# Patient Record
Sex: Female | Born: 1960 | Race: White | Hispanic: No | Marital: Married
Health system: Southern US, Community
[De-identification: ages and names within clinical notes are randomized; demographics above are authoritative.]

---

## 2001-03-06 ENCOUNTER — Other Ambulatory Visit: Admission: RE | Admit: 2001-03-06 | Discharge: 2001-03-06 | Payer: Self-pay | Admitting: *Deleted

## 2002-07-10 ENCOUNTER — Other Ambulatory Visit: Admission: RE | Admit: 2002-07-10 | Discharge: 2002-07-10 | Payer: Self-pay | Admitting: Dermatology

## 2002-07-11 ENCOUNTER — Ambulatory Visit (HOSPITAL_COMMUNITY): Admission: RE | Admit: 2002-07-11 | Discharge: 2002-07-11 | Payer: Self-pay | Admitting: Otolaryngology

## 2002-07-11 ENCOUNTER — Encounter: Payer: Self-pay | Admitting: Otolaryngology

## 2004-02-06 ENCOUNTER — Ambulatory Visit (HOSPITAL_COMMUNITY): Admission: RE | Admit: 2004-02-06 | Discharge: 2004-02-06 | Payer: Self-pay | Admitting: Family Medicine

## 2004-02-10 ENCOUNTER — Ambulatory Visit (HOSPITAL_COMMUNITY): Admission: RE | Admit: 2004-02-10 | Discharge: 2004-02-10 | Payer: Self-pay | Admitting: Family Medicine

## 2004-02-16 ENCOUNTER — Other Ambulatory Visit: Admission: RE | Admit: 2004-02-16 | Discharge: 2004-02-16 | Payer: Self-pay | Admitting: Obstetrics and Gynecology

## 2004-04-07 ENCOUNTER — Encounter (INDEPENDENT_AMBULATORY_CARE_PROVIDER_SITE_OTHER): Payer: Self-pay | Admitting: Specialist

## 2004-04-07 ENCOUNTER — Ambulatory Visit (HOSPITAL_COMMUNITY): Admission: RE | Admit: 2004-04-07 | Discharge: 2004-04-07 | Payer: Self-pay | Admitting: Obstetrics and Gynecology

## 2005-02-09 ENCOUNTER — Emergency Department (HOSPITAL_COMMUNITY): Admission: EM | Admit: 2005-02-09 | Discharge: 2005-02-09 | Payer: Self-pay | Admitting: Emergency Medicine

## 2005-05-28 IMAGING — CT CT ABDOMEN W/O CM
1 of 2 series · 15 of 32 positions shown, 19 images · non-contrast
Comparison: none

CLINICAL DATA: Renal calculus; lt flank pain
 CT ABDOMEN AND PELVIS WITHOUT CONTRAST
 Multidetector helical CT imaging abdomen and pelvis performed using kidney stone protocol.  Neither oral nor intravenous contrast utilized for this indication.
 ABDOMEN
 Prior gastric surgery.  No renal calculi or hydronephrosis.  Ureters normal caliber.  No upper abdominal mass, adenopathy, or free fluid.  The solid organs are otherwise unremarkable for an exam lacking IV and oral contrast.  No bowel dilatation.  Status post cholecystectomy. 
 IMPRESSION
 Prior cholecystectomy and gastric surgery.  No acute abnormalities.  Specifically, no urinary tract calcification or obstruction.  
 PELVIS
 No pelvic mass, adenopathy, or free fluid.  A soft tissue mass 6.2 x 4.6 cm in size is seen at the anterior left aspect of the upper uterus.  This could represent an exophytic uterine fibroid or a soft tissue mass arising from the left ovary.  This requires evaluation by pelvic and transvaginal sonography to characterize.  There appears to be a low attenuation structure adjacent to this which most likely represents normal left ovary.  No distal ureteral calcification or dilatation.  Pelvic bowel loops unremarkable.  
 6.2 x 4.6 cm diameter soft tissue mass adjacent to uterus left of midline, question uterine fibroid versus ovarian mass.  Recommend pelvic ultrasound.

[Series 8977: — · axial · 0.65mm/px · z∈[+1309,+1669]mm · 15 of 78 slices shown, 19 images]
[im 3/78  soft-tissue]
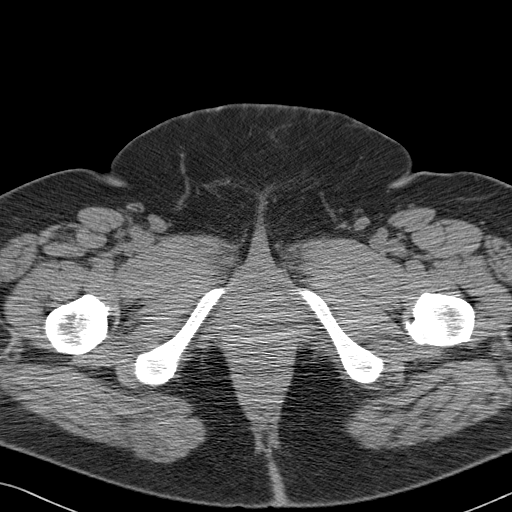
[im 3/78  bone]
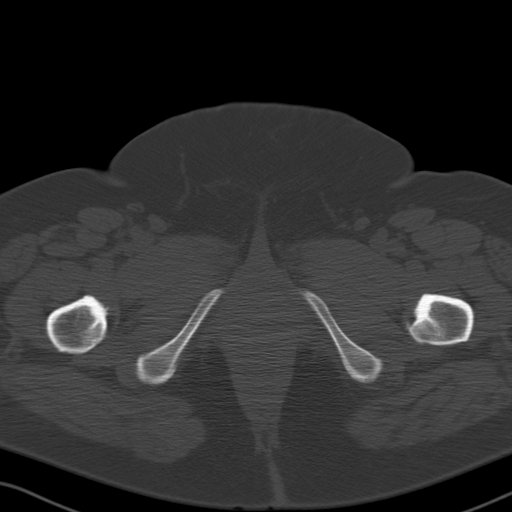
[im 9/78  soft-tissue]
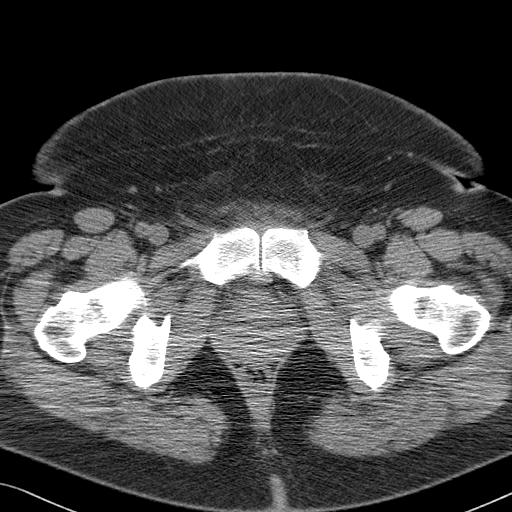
[im 15/78  soft-tissue]
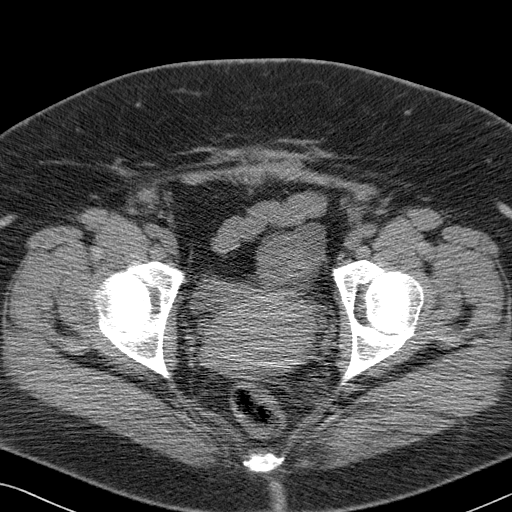
[im 21/78  soft-tissue]
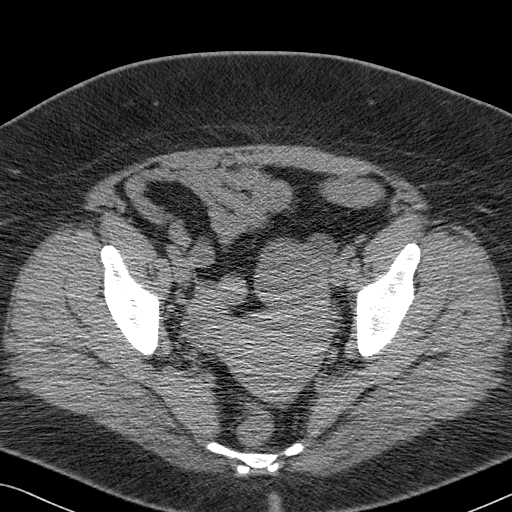
[im 27/78  soft-tissue]
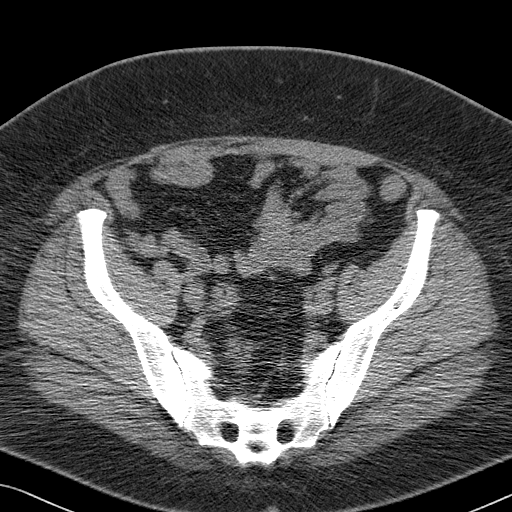
[im 33/78  soft-tissue]
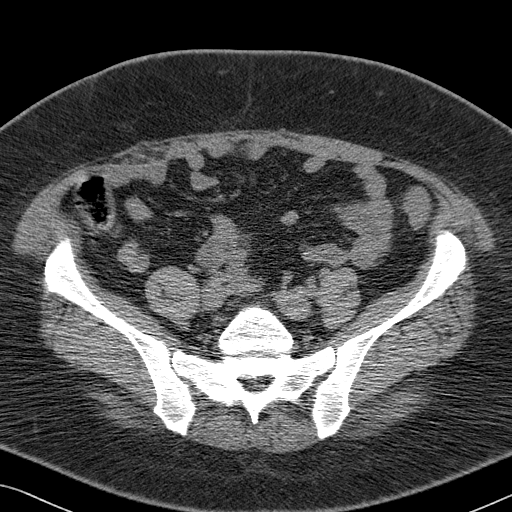
[im 39/78  soft-tissue]
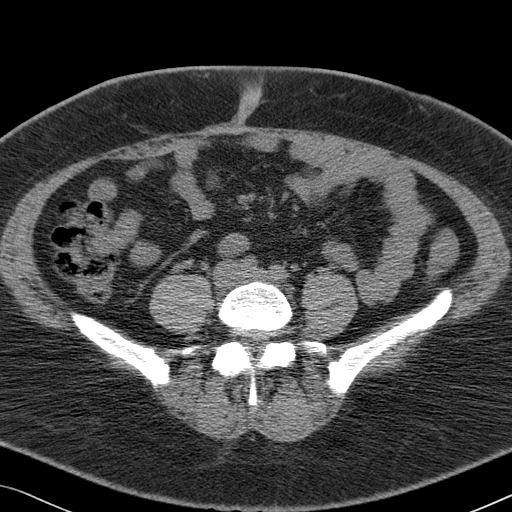
[im 45/78  soft-tissue]
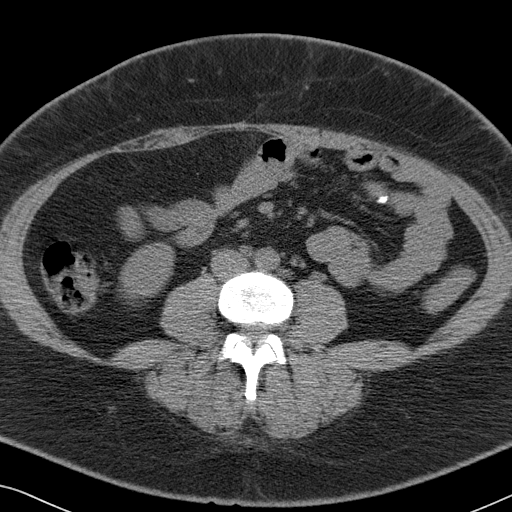
[im 51/78  soft-tissue]
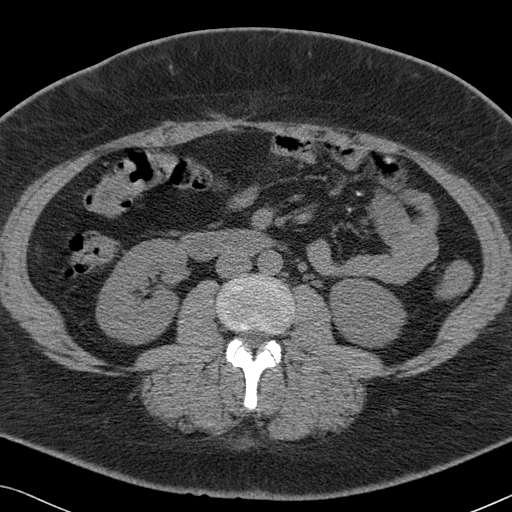
[im 51/78  bone]
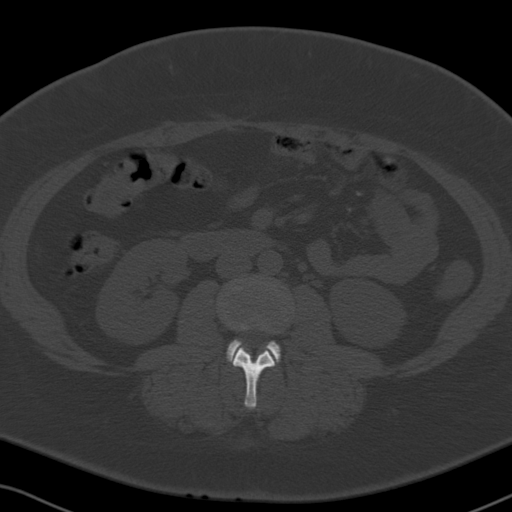
[im 57/78  soft-tissue]
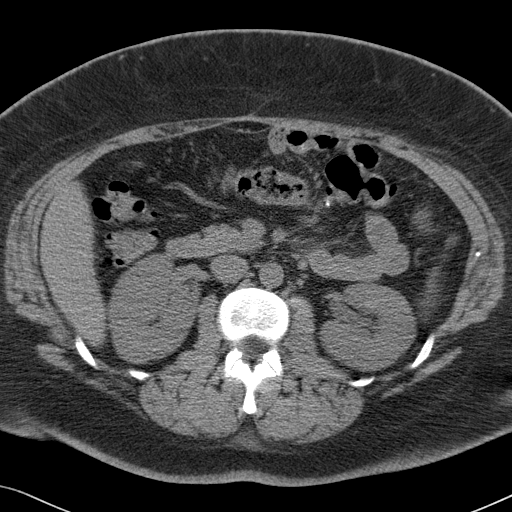
[im 63/78  soft-tissue]
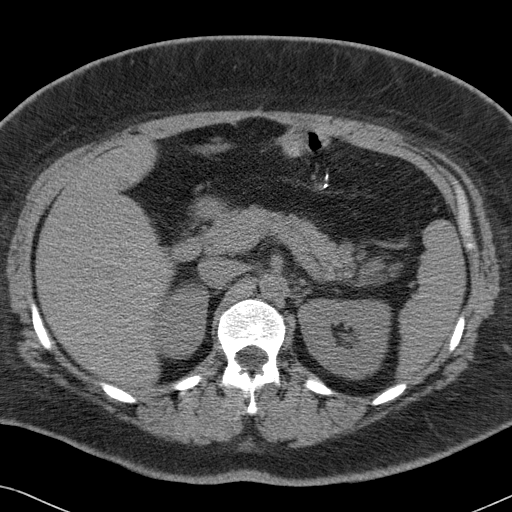
[im 66/78  lung]
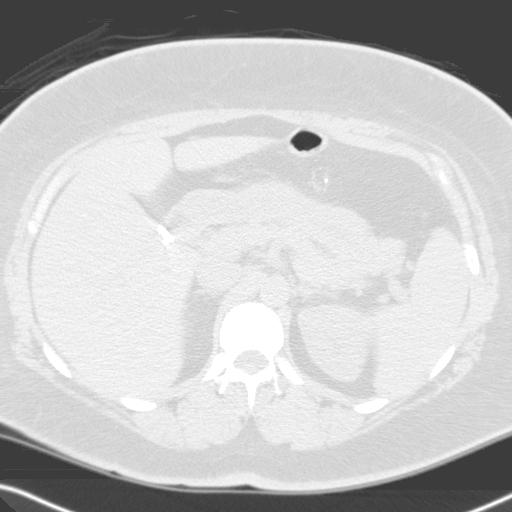
[im 69/78  soft-tissue]
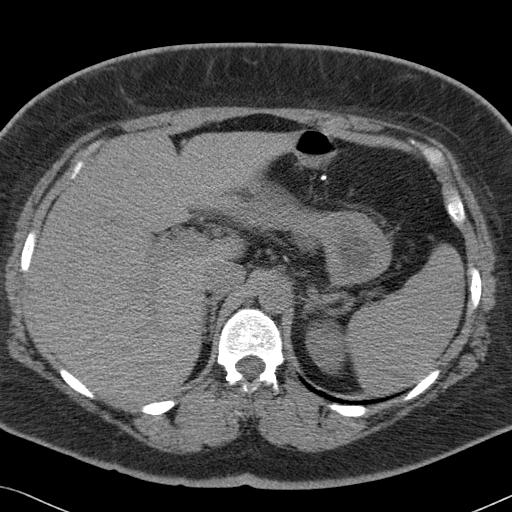
[im 69/78  lung]
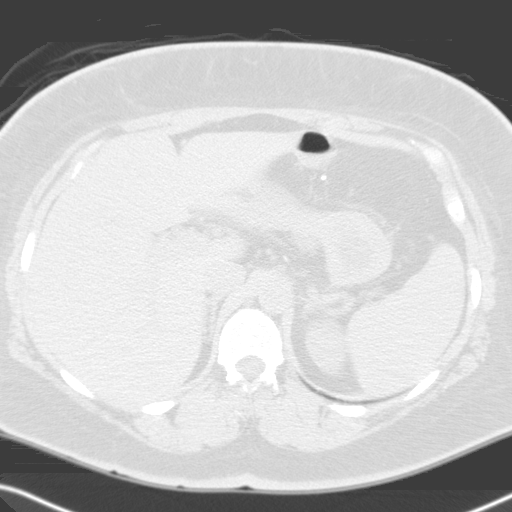
[im 72/78  lung]
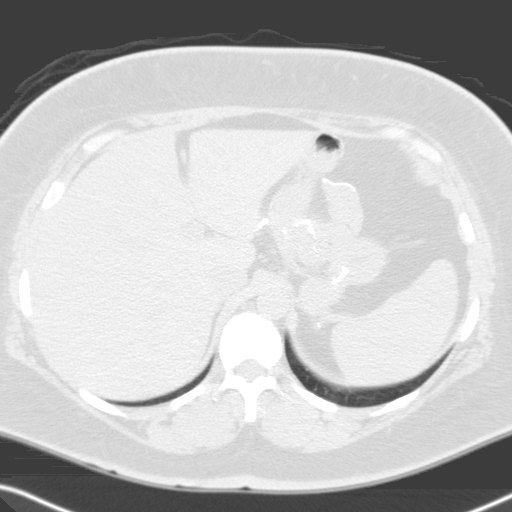
[im 75/78  soft-tissue]
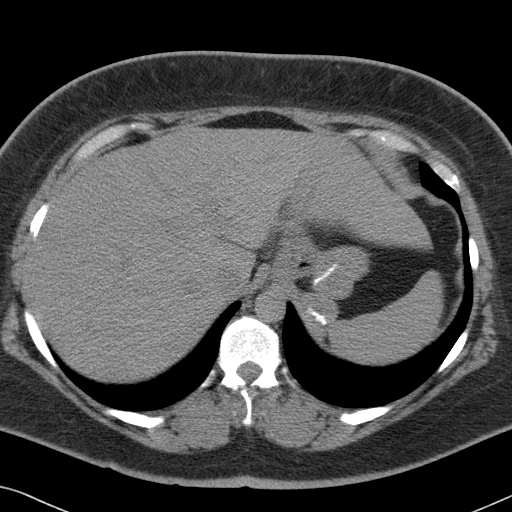
[im 75/78  lung]
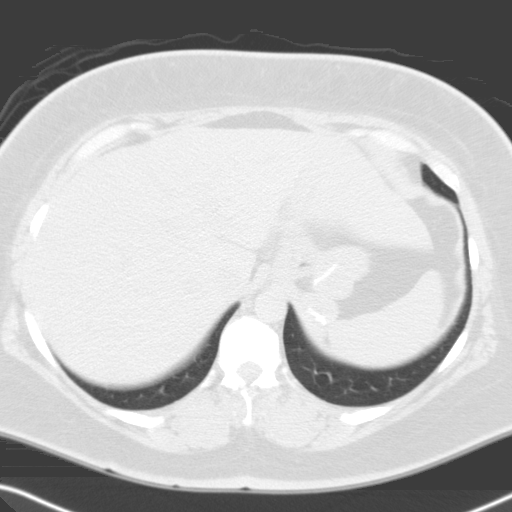

[15 of 32 positions shown; findings below may reference images not displayed]

## 2005-07-26 ENCOUNTER — Other Ambulatory Visit: Admission: RE | Admit: 2005-07-26 | Discharge: 2005-07-26 | Payer: Self-pay | Admitting: Obstetrics and Gynecology

## 2005-09-02 ENCOUNTER — Encounter (INDEPENDENT_AMBULATORY_CARE_PROVIDER_SITE_OTHER): Payer: Self-pay | Admitting: Specialist

## 2005-09-02 ENCOUNTER — Ambulatory Visit (HOSPITAL_COMMUNITY): Admission: RE | Admit: 2005-09-02 | Discharge: 2005-09-02 | Payer: Self-pay | Admitting: Obstetrics and Gynecology

## 2006-01-11 ENCOUNTER — Ambulatory Visit (HOSPITAL_COMMUNITY): Admission: RE | Admit: 2006-01-11 | Discharge: 2006-01-11 | Payer: Self-pay | Admitting: Family Medicine

## 2006-03-03 ENCOUNTER — Encounter (INDEPENDENT_AMBULATORY_CARE_PROVIDER_SITE_OTHER): Payer: Self-pay | Admitting: Specialist

## 2006-03-03 ENCOUNTER — Ambulatory Visit (HOSPITAL_COMMUNITY): Admission: RE | Admit: 2006-03-03 | Discharge: 2006-03-03 | Payer: Self-pay | Admitting: Obstetrics and Gynecology

## 2008-12-16 ENCOUNTER — Ambulatory Visit (HOSPITAL_COMMUNITY): Admission: RE | Admit: 2008-12-16 | Discharge: 2008-12-16 | Payer: Self-pay | Admitting: Family Medicine

## 2009-05-11 ENCOUNTER — Ambulatory Visit (HOSPITAL_COMMUNITY): Admission: RE | Admit: 2009-05-11 | Discharge: 2009-05-11 | Payer: Self-pay | Admitting: Family Medicine

## 2010-04-30 ENCOUNTER — Encounter: Admission: RE | Admit: 2010-04-30 | Discharge: 2010-04-30 | Payer: Self-pay | Admitting: Obstetrics and Gynecology

## 2010-08-05 ENCOUNTER — Ambulatory Visit (HOSPITAL_COMMUNITY): Admission: RE | Admit: 2010-08-05 | Discharge: 2010-08-06 | Payer: Self-pay | Admitting: Neurosurgery

## 2011-01-27 LAB — CBC
MCHC: 33.1 g/dL (ref 30.0–36.0)
RDW: 13.3 % (ref 11.5–15.5)

## 2011-01-27 LAB — BASIC METABOLIC PANEL
CO2: 32 mEq/L (ref 19–32)
Calcium: 9.2 mg/dL (ref 8.4–10.5)
Chloride: 103 mEq/L (ref 96–112)
Creatinine, Ser: 0.71 mg/dL (ref 0.4–1.2)
GFR calc Af Amer: >60 mL/min (ref >60)
GFR calc non Af Amer: >60 mL/min (ref >60)
Glucose, Bld: 124 mg/dL — ABNORMAL HIGH (ref 70–99)
Potassium: 4.2 mEq/L (ref 3.5–5.1)
Sodium: 140 mEq/L (ref 135–145)

## 2011-01-27 LAB — GLUCOSE, CAPILLARY
Glucose-Capillary: 127 mg/dL — ABNORMAL HIGH (ref 70–99)
Glucose-Capillary: 190 mg/dL — ABNORMAL HIGH (ref 70–99)

## 2011-01-27 LAB — SURGICAL PCR SCREEN: MRSA, PCR: NEGATIVE

## 2011-04-01 NOTE — Op Note (Signed)
[name], [name] NO.:  [id number]   MEDICAL RECORD NO.:  [id number]          PATIENT TYPE:  AMB   LOCATION:  [facility]                           FACILITY:  [facility]   PHYSICIAN:  Juluis Mire, M.D.   DATE OF BIRTH:  10/16/1961   DATE OF PROCEDURE:  03/03/2006  DATE OF DISCHARGE:  03/03/2006                                 OPERATIVE REPORT   PREOPERATIVE DIAGNOSES:  1.  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  2.  Cystic enlargement of the right ovary.   OPERATIVE PROCEDURE:  1.  Hysteroscopy.  2.  Endometrial curettage.  3.  NovaSure ablation.  4.  Open laparoscopy.  5.  Right ovarian cystectomies.   SURGEON:  Juluis Mire, M.D.   ANESTHESIA:  General.   ESTIMATED BLOOD LOSS:  Minimal.   PACK AND DRAINS:  None.   INTRAOPERATIVE BLOOD REPLACEMENT:  None.   COMPLICATIONS:  None.   INDICATION:  Indications are dictated in history and physical.   DESCRIPTION OF PROCEDURE:  The patient was taken to the OR and placed in a  supine position.  After satisfactory level of general endotracheal  anesthesia was obtained, the patient was placed in the dorsal lithotomy  position using the Allen stirrups.  At this point in time, the abdomen,  perineum and vagina were prepped out with Betadine and the patient was  draped out as a sterile field.  Speculum was placed in the vaginal vault.  The cervix was grasped with a Christella Hartigan tenaculum.  Uterus sounded to 10 cm.  Endocervical length was 5 cm.  The cervix was dilated to a size 33 Pratt  dilator.  The hysteroscope was introduced and the intrauterine cavity was  distended using lactated Ringer's.  Visualization revealed a normal  endometrial cavity.  Both tubal ostomies were visualized and noted to be  normal.  Endometrial curettings were then obtained.  At this point in time,  the NovaSure was deployed inside the intrauterine cavity.  Total cavity  width was 4.7 cm.  CO2 patency test was passed.  Ablation was  undertaken.  The NovaSure was then removed intact.  Repeat hysteroscopy revealed  adequately ablated endometrium and no evidence of perforation.  A Hulka  tenaculum was put in place.  Single-tooth tenaculum and speculum were then  removed.  Bladder was emptied by in-and-out catheterization.   At this point in time, a subumbilical incision was made with a knife and  carried through subcutaneous tissue.  Fascia was identified and entered  sharply and the incision in the fascia extended laterally.  The peritoneum  was entered with blunt pressure.  A Taut open laparoscopic trocar was put in  place and secured.  The laparoscope was then introduced.  A 5-mm trocar was  put in place in the suprapubic area.  Visualization revealed the left tube  and ovary to be surgically absent.  The uterus was unremarkable.  Right  ovary had 2 small cysts measuring approximately 2 to 2.5 cm each.  No  adhesions or excrescences were noted.  There was no free fluid.  A third 5-  mm trocar was put in place in the left lower quadrant after visualization of  the epigastric vessels.  The uterus was secured.  The cyst on the medial  pole was then opened, clear fluid was obtained and the cyst lining was  removed and sent for pathologic review.  Next, the cyst on the more lateral  pole was identified; it was entered and a mucinous material was noted; this  was irrigated out thoroughly and the cyst was excised from the ovary and  sent for pathologic review.  We brought about hemostasis using the bipolar.  No active bleeding was noted.  Both cysts were adequately removed.  The  abdomen deflated of its carbon dioxide.  All trocars were removed.  The  subumbilical fascia were closed with 2 figure-of-eights of 0 Vicryl.  The  suprapubic incisions were closed with Dermabond.  Sponge, instrument and  needle count were reported as correct by circulating nurse x2.  Hulka  tenaculum was then removed.  The patient was taken out of  the dorsal  lithotomy position and once alert and extubated, transferred to recovery  room in good condition.      Juluis Mire, M.D.  Electronically Signed     JSM/MEDQ  D:  03/03/2006  T:  03/06/2006  Job:  [id number]

## 2011-04-01 NOTE — H&P (Signed)
NAME:  [name], GROSS NO.:  [id number]   MEDICAL RECORD NO.:  [id number]          PATIENT TYPE:  AMB   LOCATION:  [facility]                           FACILITY:  [facility]   PHYSICIAN:  Juluis Mire, M.D.   DATE OF BIRTH:  10-Jul-1961   DATE OF ADMISSION:  03/03/2006  DATE OF DISCHARGE:                                HISTORY & PHYSICAL   The patient is a 50 year old, gravida 4, para 1, abortus 3 female who  presents for diagnostic laparoscopy with laser standby as well as  hysteroscopy and NovaSure ablation. The patient's had trouble with  menorrhagia. She had a saline infusion ultrasound performed. The patient's  had previous hysteroscopic evaluation, resection of a polyp in October 2006  with the finding of a benign pathology. Because of continued menorrhagia,  the patient has decided to proceed with endometrial ablation. Other  alternatives have been discussed including birth control pills or a Mirena  IUD.   The patient has also been followed with a right ovarian cyst. 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 We have been watching the right ovary, it had a complex cyst  that remained relatively stable in the 3-4 cm side. We are going to proceed  with laparoscopic evaluation.   ALLERGIES:  She is allergic to PENICILLIN and ERYTHROMYCIN.   MEDICATIONS:  Lexapro, Diovan and multivitamins.   PAST MEDICAL HISTORY:  Under active management of hypertension by Dr.  Phillips Odor. On medications as noted.   PAST SURGICAL HISTORY:  1.  As an infant she had intussusception, had exploratory surgery with      correction of intussusception. Her appendix was removed at that time.  2.  Had exploratory laparotomy done in 1999 and removal of a paratubular      cyst.  3.  She had a laparoscopic cholecystectomy in November 1992.  4.  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  5.  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   OBSTETRICAL HISTORY:  She has had two prior cesarean sections, other  pregnancies were miscarriages.   FAMILY HISTORY:  Father with a history of diabetes, mother and father have a  history of hypertension and heart disease.   SOCIAL HISTORY:  There is no tobacco or alcohol.   REVIEW OF SYSTEMS:  Noncontributory.   PHYSICAL EXAMINATION:  VITAL SIGNS:  The patient is afebrile with stable  vital signs.  HEENT:  The patient is normocephalic, pupils equal round and reactive to  light and accommodation. Extraocular movements intact. Sclera and  conjunctiva were clear. Oropharynx clear.  NECK:  Without thyromegaly.  BREASTS:  No discreet masses.  LUNGS:  Clear.  CARDIOVASCULAR:  Regular rate and rhythm without murmurs or gallops.  ABDOMEN:  Benign, no mass, organomegaly or tenderness.  PELVIC:  Normal external genitalia. Vaginal mucosa clear. Cervix  unremarkable. Uterus __________ normal size. Adnexa unremarkable, no  palpable abnormalities.  EXTREMITIES:  Trace edema.  NEUROLOGIC:  Grossly within normal limits.   IMPRESSION:  1.  Persistent cystic enlargement of the right ovary.  2.  Menorrhagia, probable adenomyosis.  3.  Numerous gynecological and abdominal procedures.   PLAN:  The patient will undergo open laparoscopy for evaluation of the right  tube. We are going to try to keep the ovary intact and possibly remove the  cyst. We will then perform hysteroscopy and NovaSure ablation. Success rates  of approximately 90% are quoted with amenorrhea rates of 40%. The risk of  the procedures have been discussed including risk of infection. The risk of  hemorrhage that could require transfusion, risk of AIDS or hepatitis, risk  of injury to adjacent organs requiring exploratory surgery, risk of deep  venous thrombosis and pulmonary embolus. The patient expressed an  understanding of  the indications and risks.      Juluis Mire, M.D.  Electronically Signed     JSM/MEDQ  D:  03/03/2006  T:  03/03/2006  Job:  [id number]

## 2011-04-01 NOTE — Op Note (Signed)
NAME:  [name], [name]                         ACCOUNT NO.:  [id number]   MEDICAL RECORD NO.:  [id number]                   PATIENT TYPE:  AMB   LOCATION:  [facility]                                  FACILITY:  [facility]   PHYSICIAN:  Juluis Mire, M.D.                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   OPERATION:  Open laparoscopy, lysis of adhesions. Left salpingo-  oophorectomy.   SURGEON:  Juluis Mire, M.D.   ANESTHESIA:  General endotracheal.   ESTIMATED BLOOD LOSS:  Minimal.   PACKS/DRAINS:  None.   BLOOD REPLACED:  None.   COMPLICATIONS:  None.   INDICATIONS FOR PROCEDURE:  As dictated in the history and physical.   DESCRIPTION OF PROCEDURE:  The patient was taken to the OR, placed in the  supine position. After a satisfactory level of general endotracheal  anesthesia was obtained, the patient was placed in the dorsal lithotomy  position using the Allen stirrups. The abdomen, perineum and vagina were  prepped out with Betadine. The Foley was placed to straight drain.  Examination under anesthesia did reveal a left sided adnexal mass.  A Hulka  tenaculum was put in place and secured. The patient was draped out as a  sterile field. A subumbilical incision made with a knife.  The incision was  extended through the subcutaneous tissue. The fascia was identified and  entered sharply, incision in the fascia extended laterally.  The rectus  muscle was separated in the midline. The peritoneum was elevated and  entered. There was no evidence of periumbilical adhesions, no evidence of  injury to adjacent organs.  The laparoscopic trocar was put in place and  secured. The laparoscope was then introduced, no ascites were noted.  A 5 mm  trocar was put in place in the suprapubic area under direct visualization,  the uterus was elevated, the left ovary was  involved with multiple cystic  enlargements. There was one small adhesion that was taken down, this was to  the left abdominal sidewall. There was on ascites, there was no evidence of  any metastatic process going on. We have used the whole abdomen including  the upper abdomen, liver. Her appendix was surgically absent, gallbladder  was surgically absent. She had one omental adhesions in the upper abdomen.  Visualization of the omentum revealed it to be unremarkable.  The uterus was  elevated, there was a fundal fibroid. The right ovary was somewhat adherent  to the right pelvic sidewall but unremarkable. We did pelvic washings at  this point, these were sent for cytology. A third 5 mm trocar was put in  place in the left lower quadrant. The ovary was  again elevated on the left  side and visualized. There were multiple cysts, no solid areas, no  excrescences on the outside. We first identified the ureter and the pelvic  sidewall then using the gyrus tripolar we cauterized and incised the ovarian  vasculature well above the ureter. We continued this dissection up to the  uteroovarian pedicle.  It was then isolated, cauterized and incised using  the tripolar.  We had good hemostasis at this point. The laparoscope and  subumbilical trocar was removed. We isolated the left ovary under the  umbilicus. We elevated it with Kelly's. We then made small cystostomy  incisions and drained the cyst with the Nezhat suction. I do not think we  had any or at least much intraabdominal spill. After drainage of 1 or 2  cysts, we delivered the ovary intact.  We incised the cyst, they were smooth  walled, no papillary areas and I did not detect any solid areas. This was  sent for routine pathology.  We the put the open laparoscopic trocar back in  place and secured it, the laparoscope was  reintroduced.  I thoroughly  irrigated the pelvis and upper abdomen.  All irrigation was removed.  I went  back on the left where the left ovary had been and there was one little area  oozing and again we used the tripolar to bring this under control.  At this  point in time, we had excellent hemostasis.  Again thoroughly irrigated the  pelvis, all irrigation was removed. Urine output remained clear and  adequate. At this point in time, the abdomen was deflated with carbon  dioxide, all trocars removed. The subumbilical fascia was closed with two  figure-of-eights of #0 Vicryl, skin with interrupted subcuticular's of 4-0  Vicryl, suprapubic incisions were closed with Dermabond.  A Hulka tenaculum  was then removed. The Foley was removed. The patient was taken out of the  dorsal lithotomy position once alert and extubated and transferred to the  recovery room in good condition. Sponge, needle and instrument counts were  reported as correct by the circulating nurse x2.                                               Juluis Mire, M.D.    JSM/MEDQ  D:  04/07/2004  T:  04/07/2004  Job:  [id number]

## 2011-04-01 NOTE — Op Note (Signed)
[name], [name] NO.:  [id number]   MEDICAL RECORD NO.:  [id number]          PATIENT TYPE:  AMB   LOCATION:  [facility]                           FACILITY:  [facility]   PHYSICIAN:  Juluis Mire, M.D.   DATE OF BIRTH:  06/05/1961   DATE OF PROCEDURE:  09/02/2005  DATE OF DISCHARGE:                                 OPERATIVE REPORT   PREOPERATIVE DIAGNOSES:  1.  Abnormal uterine bleeding.  2.  Endometrial polyp.  3.  Uterine fibroids.   POSTOPERATIVE DIAGNOSES:  1.  Abnormal uterine bleeding.  2.  Endometrial polyp.  3.  Uterine fibroids.   OPERATIVE PROCEDURES:  1.  Hysteroscopy.  2.  Endometrial biopsies.  3.  Endometrial curettings.   SURGEON:  Juluis Mire, M.D.   ANESTHESIA:  Paracervical block and sedation.   ESTIMATED BLOOD LOSS:  Minimal.   PACKS AND DRAINS:  None.   INTRAOPERATIVE BLOOD REPLACED:  None.   COMPLICATIONS:  None.   INDICATIONS:  As dictated in the history and physical.   PROCEDURE:  The patient was taken to the OR and placed in the supine  position.  After a satisfactory level of sedation, was placed in the dorsal  lithotomy position using the Allen stirrups.  The patient was then draped in  a sterile field.  A speculum was placed in the vaginal vault.  The cervix  and vagina were cleansed with Betadine.  A paracervical block was instituted  using 1% Nesacaine.  The cervix was grasped with a single-tooth tenaculum.  The uterus was posterior and sounded to approximately 11 cm.  The cervix was  serially dilated to a size 35 Pratt dilator.  The operative hysteroscope was  introduced and the intrauterine cavity was distended using sorbitol.  Visualization revealed a posterior wall fibroid impinging upon the uterine  cavity.  A distinct polyp was not noted.  Multiple endometrial biopsies were  obtained along with curettings.  We had good hemostasis, no signs of  perforation or  damage.  At this point in time the hysteroscope was  removed, the single-  tooth tenaculum and speculum were then removed.  The patient was taken out  of the dorsal lithotomy position.  The total deficit was 20 mL.  The patient  once alert was transferred to the recovery room in good condition.      Juluis Mire, M.D.  Electronically Signed     JSM/MEDQ  D:  09/02/2005  T:  09/02/2005  Job:  [id number]

## 2011-04-01 NOTE — H&P (Signed)
[name], [name] NO.:  [id number]   MEDICAL RECORD NO.:  [id number]          PATIENT TYPE:  AMB   LOCATION:  [facility]                           FACILITY:  [facility]   PHYSICIAN:  Juluis Mire, M.D.   DATE OF BIRTH:  14-Sep-1961   DATE OF ADMISSION:  09/02/2005  DATE OF DISCHARGE:                                HISTORY & PHYSICAL   The patient is a 50 year old gravida 4, para 2-0-2-1, married female who  presents for a hysteroscopy.   The patient' history is significant in that in 2005 she underwent a  laparoscopic left salpingo-oophorectomy with finding of a serous  adenofibroma.  She also was noted to have a small uterine fibroid.  It was  noted that her cycles were every 21 days, she had five days of flow, two  days being heavy, changing pads and tampons every two hours.  In view of the  bleeding pattern, we decided to proceed with saline infusion ultrasound.  On  ultrasound, first of all she had a 4 cm complex cyst of the right ovary that  could have been a recurrent cystadenofibroma.  Saline infusion revealed a  large polypoid mass within the endometrial cavity.  She had one small  intramural fibroid measuring 2.6 cm.  We have done a follow-up ultrasound on  the 5th.  It has decreased in size from 4.1 cm to 3.6 cm, still somewhat  complex and the possibility of a recurrent cystadenofibroma has been  entertained.  At the present time, in view of the endometrial process we are  going to proceed with hysteroscopic resection to rule out any type of  endometrial process before we proceed with further surgical management of  both the ovarian cyst and her menorrhagia.   ALLERGIES:  The patient is allergic to PENICILLIN and E-MYCIN.   MEDICATIONS:  Vitamins and Clarinex.   PAST MEDICAL HISTORY:  She has the usual childhood diseases without  significant sequelae.  She does have a history of hypertension.  She has  been on Diovan in the past.   PAST SURGICAL HISTORY:   She had gastric bypass in September 2004.  She had a  cyst removed from her fallopian tube in 1991, a cholecystectomy in 1992,  underwent laparoscopy in 2005 with removal of the left ovary, finding of a  serous cystadenoma.   OBSTETRICAL HISTORY:  She had two cesarean sections and two miscarriages.   FAMILY HISTORY:  Father with a history of diabetes.  Brother with a history  of lung cancer.  Mother and father have hypertension.  Mother with heart  disease.   SOCIAL HISTORY:  No tobacco or alcohol use.   REVIEW OF SYSTEMS:  Noncontributory.   PHYSICAL EXAMINATION:  VITAL SIGNS:  The patient is afebrile with stable  vital signs.  HEENT:  Patient normocephalic.  Pupils equal, round, and reactive to light  and accommodation.  Extraocular movements were intact.  Sclerae and  conjunctivae were clear.  Oropharynx clear.  NECK:  Without thyromegaly.  BREASTS:  No discrete masses.  LUNGS:  Clear.  CARDIAC:  Regular rhythm  and rate without murmurs or gallops.  ABDOMEN:  Benign.  No mass, organomegaly or tenderness.  PELVIC:  Normal external genitalia.  Vaginal mucosa is clear.  Cervix  unremarkable.  Uterus feels to be a normal size and shape.  Adnexa  unremarkable.  EXTREMITIES:  Trace edema.  NEUROLOGIC:  Grossly within normal limits.   IMPRESSION:  1.  Menorrhagia with large endometrial polyp.  2.  Cystic enlargement of right ovary.   PLAN:  At the present time we are going to proceed with just hysteroscopic  evaluation and resection of the polyp.  The risks of surgery have been  discussed, including the risk of infection; the risk of hemorrhage that  could require transfusion with the risk of AIDS or hepatitis or risk of  hysterectomy; risk of injury to adjacent organs requiring exploratory  surgery; risk of deep venous thrombosis and pulmonary embolus.  The patient  expressed an understanding of indications and risks.      Juluis Mire, M.D.  Electronically Signed      JSM/MEDQ  D:  09/02/2005  T:  09/02/2005  Job:  [id number]

## 2011-04-01 NOTE — H&P (Signed)
NAME:  ARDEN, AXON NO.:  [id number]   MEDICAL RECORD NO.:  [id number]                   PATIENT TYPE:   LOCATION:                                       FACILITY:   PHYSICIAN:  Juluis Mire, M.D.                DATE OF BIRTH:   DATE OF ADMISSION:  04/07/2004  DATE OF DISCHARGE:                                HISTORY & PHYSICAL   The patient is a 50 year old gravida 5, para 2-0-3-1 married white female  who presents for open laparoscopy with left salpingo-oophorectomy, possible  exploratory laparotomy with total abdominal hysterectomy, possible bilateral  salpingo-oophorectomy, possible staging procedures with an ovariectomy and  lymphadenectomy.   In relation to the present admission the patient was referred to our  practice by Dr. Assunta Found for evaluation of a left pelvic mass. The  patient was under evaluation for renal stones. A CT scan done on March 29  revealed an 8 x 4 x 7 cm complex cyst of the left adnexa. A neoplastic  process could not be ruled out. Subsequently we saw her back where an  ultrasound evaluation did reveal an 8 x 4 x 6 multicystic mass. It did have  some low level echos. It looked like a possible cystadenoma such as a  mucinous cystadenoma. There was no ascites or free fluid. A CA 125 was  obtained which was within normal limits of 21.8. Per the patient's request  we are now going to proceed with open laparoscopy and attempt at  laparoscopic removal of the left adnexal mass, potential neoplastic process.  It is discussed with her that this could be a borderline tumor or a  malignancy. She does understand that then further staging procedures may be  indicated. She also understands that we may not be able to get this out with  the laparoscope at which time exploratory surgery will be undertaken and  appropriate surgical management. If we have to explore her she is desirous  of removing the uterus and leaving the right  ovary per her request.   OTHER GYNECOLOGICAL ISSUES:  The patient does report 28-day cycles, 4-5 days  of heavy flow. She does have some dysmenorrhea. We did notice on ultrasound  a uterine fibroid that measures 3.8 x 2.6 cm. She also has a past history of  abnormal cervical cytology treated with cryotherapy. So far followup has  indeed been negative.   ALLERGIES:  PENICILLIN.   MEDICATIONS:  Lexapro, Diovan and multivitamins.   PAST MEDICAL HISTORY:  Under active management for hypertension by Dr.  Phillips Odor on medication as noted.   PAST SURGICAL HISTORY:  1. As an infant she had intussusception, had exploratory surgery with     correction of intussusception. Evidently the appendix was removed at that     time.  2. She has also had an exploratory laparotomy done in 1991 with removal of a  peritubular cyst.  3. She has had a laparoscopic cholecystectomy done in November of 1992.  4. She has had laparoscopic gastric bypass surgery in September 2004.  5. She has had two prior cesarean sections also. Other pregnancies were     abortions.   FAMILY HISTORY:  Father has a history of diabetes. Mother, father have a  history of hypertension and heart disease.   SOCIAL HISTORY:  There is no tobacco or alcohol use.   REVIEW OF SYSTEMS:  Noncontributory.   PHYSICAL EXAMINATION:  VITAL SIGNS:  The patient is afebrile with stable vital signs.  HEENT:  The patient is normocephalic. Pupils are equal, round and reactive  to light and accommodation. Extraocular movements intact. Sclerae and  conjunctivae are clear. Oropharynx clear.  NECK:  Without thyromegaly.  BREASTS:  No discrete masses.  LUNGS:  Clear.  CARDIOVASCULAR:  Regular rate and rhythm without murmurs or gallops.  ABDOMEN:  She has a midline incision just to the right from the previous  surgery as a child for intussusception. She has previous low transverse  incisions and also has the noted laparoscopic incisions from the  gastric  bypass surgery. There are no masses, organomegaly or tenderness.  PELVIC:  Normal external genitalia. Vaginal mucosa is clear. Cervix is  unremarkable. Uterus is upper limits of normal size, slightly irregular. She  does have the left adnexal fullness going posteriorly to the uterus. Right  adnexa is unremarkable.  EXTREMITIES:  Trace edema.  NEUROLOGIC:  Gross normal limits.   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  2. Uterine fibroids.  3. History of cervical dysplasia.  4. Previous gastric bypass surgery.  5. Previous abdominal surgeries.   PLAN:  At the present time we are going to attempt open laparoscopic  evaluation of the adnexa and attempt to remove the left tube and ovary.  Should this be unsuccessful exploratory surgery will be undertaken. If need  be staging procedures would be undertaken should this be a borderline or  malignant tumor. It is her request that if we do exploratory surgery the  uterus be removed due to her cycles and uterine fibroid. The right ovary  would be left behind. She does understand the potential risk for malignant  transformation. The overall risks of surgery have been discussed including  the risks of infection. The risk of hemorrhage that could require  transfusion. The risk of AIDS or hepatitis. The risk of injury to adjacent  organs including bladder, bowel or ureters that could require further  exploratory surgery. The risk of deep venous thrombosis and pulmonary  embolus. The patient expressed understanding of the indications and risks.                                               Juluis Mire, M.D.    JSM/MEDQ  D:  04/07/2004  T:  04/07/2004  Job:  [id number]   cc:   Corrie Mckusick, M.D.  [address] [place]. [address]  [place]  [place] [address]  Fax: [phone]

## 2011-04-05 ENCOUNTER — Other Ambulatory Visit (HOSPITAL_COMMUNITY): Payer: Self-pay | Admitting: Internal Medicine

## 2011-04-05 DIAGNOSIS — J019 Acute sinusitis, unspecified: Secondary | ICD-10-CM

## 2011-04-05 DIAGNOSIS — R51 Headache: Secondary | ICD-10-CM

## 2011-04-06 ENCOUNTER — Other Ambulatory Visit (HOSPITAL_COMMUNITY): Payer: Self-pay | Admitting: Internal Medicine

## 2011-04-06 DIAGNOSIS — J019 Acute sinusitis, unspecified: Secondary | ICD-10-CM

## 2011-04-06 DIAGNOSIS — R51 Headache: Secondary | ICD-10-CM

## 2011-04-07 ENCOUNTER — Ambulatory Visit (HOSPITAL_COMMUNITY)
Admission: RE | Admit: 2011-04-07 | Discharge: 2011-04-07 | Disposition: A | Discharge status: Home / Self Care | Payer: 59 | Source: Ambulatory Visit | Attending: Internal Medicine | Admitting: Internal Medicine

## 2011-04-07 DIAGNOSIS — R51 Headache: Secondary | ICD-10-CM

## 2011-04-07 DIAGNOSIS — J019 Acute sinusitis, unspecified: Secondary | ICD-10-CM | POA: Insufficient documentation

## 2011-04-18 ENCOUNTER — Ambulatory Visit (INDEPENDENT_AMBULATORY_CARE_PROVIDER_SITE_OTHER): Payer: Self-pay | Admitting: Internal Medicine

## 2011-07-28 ENCOUNTER — Ambulatory Visit (INDEPENDENT_AMBULATORY_CARE_PROVIDER_SITE_OTHER): Payer: 59 | Admitting: Otolaryngology

## 2011-07-28 DIAGNOSIS — J310 Chronic rhinitis: Secondary | ICD-10-CM

## 2011-07-28 DIAGNOSIS — J343 Hypertrophy of nasal turbinates: Secondary | ICD-10-CM

## 2011-11-19 IMAGING — CR DG CHEST 2V
2 series · 2 of 2 positions shown · non-contrast
Comparison: None.

CLINICAL DATA: Cervical spondylosis

CHEST - 2 VIEW

[view not recorded (1 of 2)]
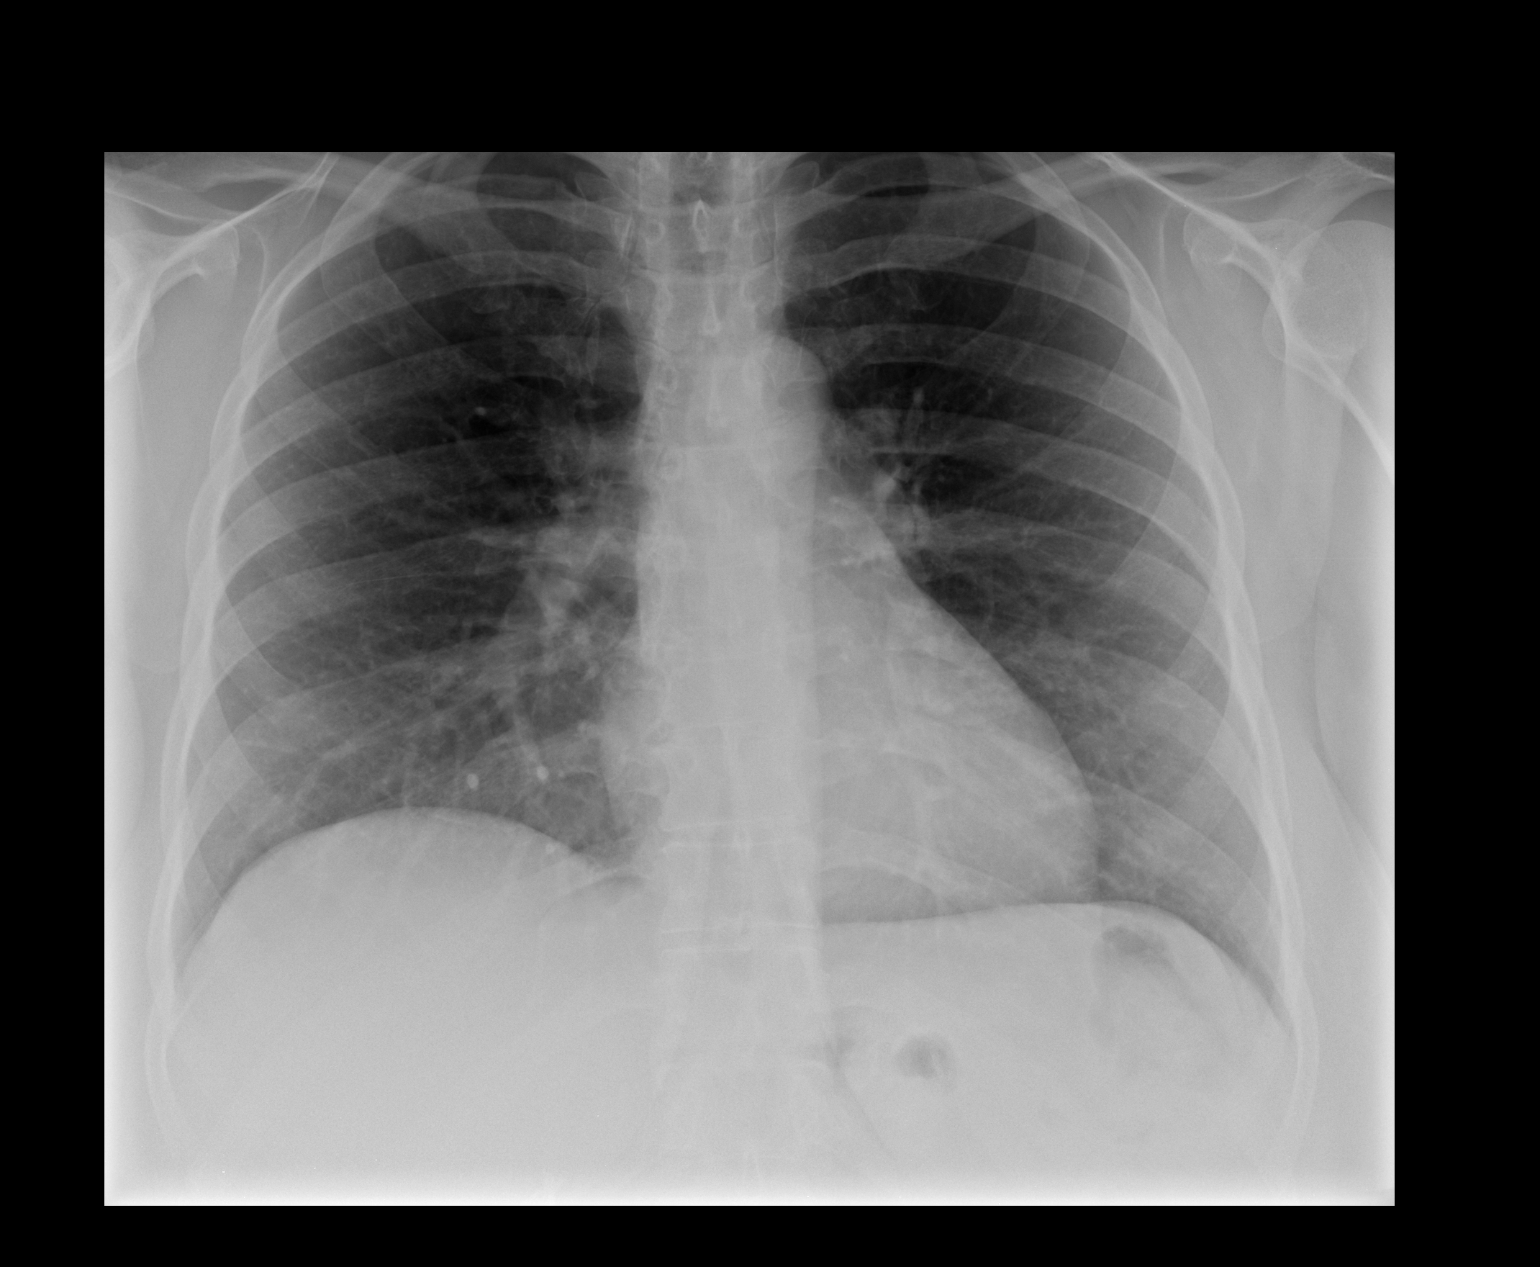

[view not recorded (2 of 2)]
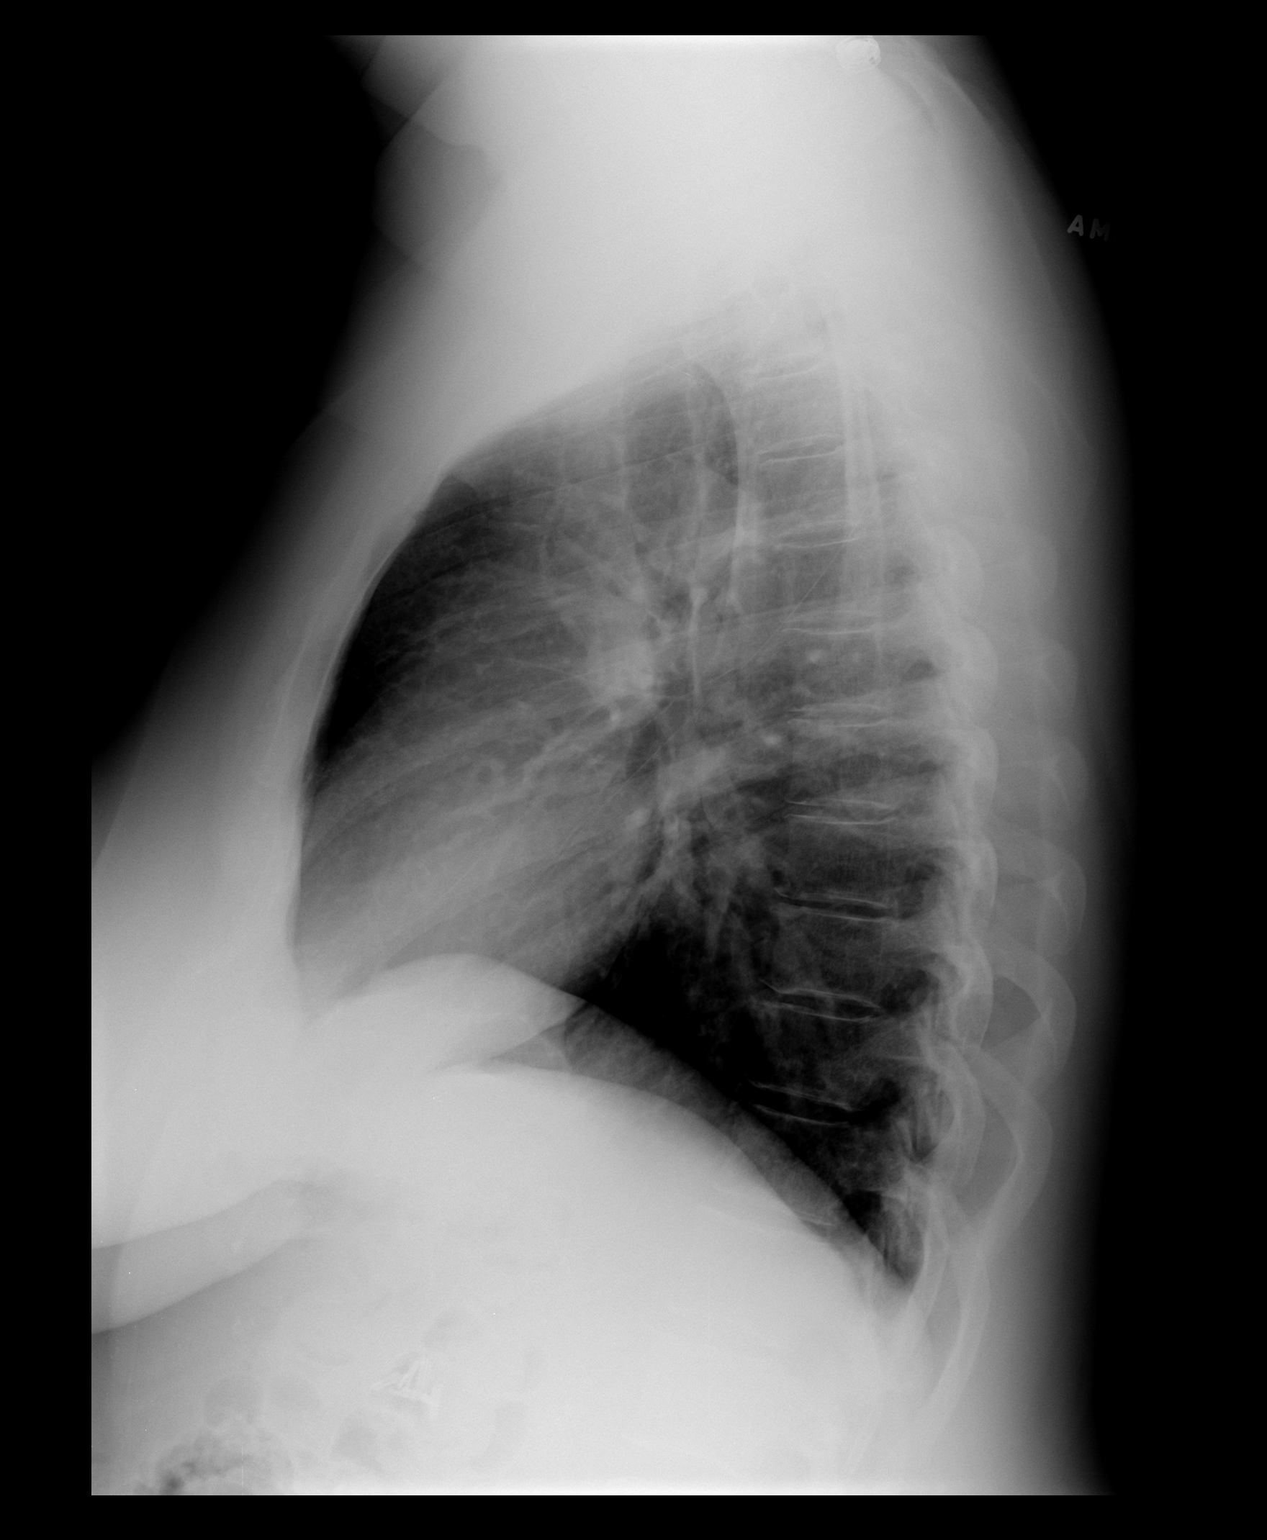

[2 of 2 positions shown; findings below may reference images not displayed]

FINDINGS: The lungs are clear bilaterally.  No confluent airspace
opacities, pleural effuions or pneumothoracies are seen.  The heart
is normal in size and contour.  The upper abdomen and osseous
structures are normal.
IMPRESSION: No acute cardiopulmonary disease.

## 2012-02-17 ENCOUNTER — Telehealth: Payer: Self-pay

## 2012-02-17 NOTE — Telephone Encounter (Signed)
Called pt to triage for colonoscopy. ( See Med list)  . She was heading out to work. Said she will call back. Will probably need an OV prior to procedure.

## 2012-02-28 ENCOUNTER — Telehealth: Payer: Self-pay

## 2012-02-28 NOTE — Telephone Encounter (Signed)
LMOM for a return call. ( Might need OV due to meds. )

## 2012-02-28 NOTE — Telephone Encounter (Signed)
See OV 02/28/2012.

## 2012-03-09 NOTE — Telephone Encounter (Signed)
Called pt.many rings and no answer. Will mail letter to pt to call.

## 2014-02-03 ENCOUNTER — Other Ambulatory Visit (HOSPITAL_COMMUNITY): Payer: Self-pay | Admitting: Family Medicine

## 2014-02-03 ENCOUNTER — Ambulatory Visit (HOSPITAL_COMMUNITY)
Admission: RE | Admit: 2014-02-03 | Discharge: 2014-02-03 | Disposition: A | Discharge status: Home / Self Care | Payer: BC Managed Care – PPO | Source: Ambulatory Visit | Attending: Family Medicine | Admitting: Family Medicine

## 2014-02-03 DIAGNOSIS — M545 Low back pain, unspecified: Secondary | ICD-10-CM

## 2014-02-03 DIAGNOSIS — M5137 Other intervertebral disc degeneration, lumbosacral region: Secondary | ICD-10-CM | POA: Insufficient documentation

## 2014-02-03 DIAGNOSIS — M25559 Pain in unspecified hip: Secondary | ICD-10-CM

## 2014-02-03 DIAGNOSIS — M51379 Other intervertebral disc degeneration, lumbosacral region without mention of lumbar back pain or lower extremity pain: Secondary | ICD-10-CM | POA: Insufficient documentation

## 2014-02-03 DIAGNOSIS — M47817 Spondylosis without myelopathy or radiculopathy, lumbosacral region: Secondary | ICD-10-CM | POA: Insufficient documentation

## 2014-02-03 DIAGNOSIS — Q762 Congenital spondylolisthesis: Secondary | ICD-10-CM | POA: Insufficient documentation

## 2014-02-21 ENCOUNTER — Encounter: Payer: Self-pay | Admitting: Internal Medicine

## 2014-03-04 ENCOUNTER — Telehealth: Payer: Self-pay | Admitting: Gastroenterology

## 2014-03-04 ENCOUNTER — Encounter: Payer: Self-pay | Admitting: Gastroenterology

## 2014-03-04 ENCOUNTER — Ambulatory Visit: Payer: BC Managed Care – PPO | Admitting: Gastroenterology

## 2014-03-04 NOTE — Telephone Encounter (Signed)
Mailed letter °

## 2014-03-04 NOTE — Telephone Encounter (Signed)
Please send letter.

## 2014-03-04 NOTE — Telephone Encounter (Signed)
Pt was a no show
